# Patient Record
Sex: Female | Born: 1960 | Race: Black or African American | Hispanic: No | Marital: Single
Health system: Southern US, Community
[De-identification: ages and names within clinical notes are randomized; demographics above are authoritative.]

## PROBLEM LIST (undated history)

## (undated) DIAGNOSIS — E785 Hyperlipidemia, unspecified: Secondary | ICD-10-CM

## (undated) DIAGNOSIS — F0390 Unspecified dementia without behavioral disturbance: Secondary | ICD-10-CM

## (undated) DIAGNOSIS — G40909 Epilepsy, unspecified, not intractable, without status epilepticus: Secondary | ICD-10-CM

## (undated) DIAGNOSIS — Q909 Down syndrome, unspecified: Secondary | ICD-10-CM

## (undated) DIAGNOSIS — I10 Essential (primary) hypertension: Secondary | ICD-10-CM

---

## 2017-03-14 ENCOUNTER — Emergency Department (HOSPITAL_COMMUNITY)
Admission: EM | Admit: 2017-03-14 | Discharge: 2017-03-14 | Disposition: A | Discharge status: Home / Self Care | Payer: Medicare Other | Attending: Emergency Medicine | Admitting: Emergency Medicine

## 2017-03-14 ENCOUNTER — Encounter (HOSPITAL_COMMUNITY): Payer: Self-pay | Admitting: Nurse Practitioner

## 2017-03-14 ENCOUNTER — Emergency Department (HOSPITAL_COMMUNITY): Payer: Medicare Other

## 2017-03-14 DIAGNOSIS — Y999 Unspecified external cause status: Secondary | ICD-10-CM | POA: Diagnosis not present

## 2017-03-14 DIAGNOSIS — Y939 Activity, unspecified: Secondary | ICD-10-CM | POA: Insufficient documentation

## 2017-03-14 DIAGNOSIS — Q909 Down syndrome, unspecified: Secondary | ICD-10-CM | POA: Diagnosis not present

## 2017-03-14 DIAGNOSIS — F0390 Unspecified dementia without behavioral disturbance: Secondary | ICD-10-CM | POA: Diagnosis not present

## 2017-03-14 DIAGNOSIS — X509XXA Other and unspecified overexertion or strenuous movements or postures, initial encounter: Secondary | ICD-10-CM | POA: Diagnosis not present

## 2017-03-14 DIAGNOSIS — Y929 Unspecified place or not applicable: Secondary | ICD-10-CM | POA: Diagnosis not present

## 2017-03-14 DIAGNOSIS — S92911A Unspecified fracture of right toe(s), initial encounter for closed fracture: Secondary | ICD-10-CM | POA: Insufficient documentation

## 2017-03-14 DIAGNOSIS — S92912A Unspecified fracture of left toe(s), initial encounter for closed fracture: Secondary | ICD-10-CM

## 2017-03-14 DIAGNOSIS — S91311A Laceration without foreign body, right foot, initial encounter: Secondary | ICD-10-CM | POA: Insufficient documentation

## 2017-03-14 DIAGNOSIS — S99921A Unspecified injury of right foot, initial encounter: Secondary | ICD-10-CM | POA: Diagnosis present

## 2017-03-14 DIAGNOSIS — Z79899 Other long term (current) drug therapy: Secondary | ICD-10-CM | POA: Diagnosis not present

## 2017-03-14 DIAGNOSIS — I10 Essential (primary) hypertension: Secondary | ICD-10-CM | POA: Insufficient documentation

## 2017-03-14 HISTORY — DX: Unspecified dementia, unspecified severity, without behavioral disturbance, psychotic disturbance, mood disturbance, and anxiety: F03.90

## 2017-03-14 HISTORY — DX: Down syndrome, unspecified: Q90.9

## 2017-03-14 HISTORY — DX: Essential (primary) hypertension: I10

## 2017-03-14 MED ORDER — LIDOCAINE HCL (PF) 1 % IJ SOLN
5.0000 mL | Freq: Once | INTRAMUSCULAR | Status: AC
  Administered 2017-03-14: 5 mL

## 2017-03-14 MED ORDER — HYDROCODONE-ACETAMINOPHEN 5-325 MG PO TABS: 1.0000 | ORAL_TABLET | Freq: Four times a day (QID) | ORAL | 0 refills | Status: AC | PRN

## 2017-03-14 MED ORDER — LIDOCAINE-EPINEPHRINE-TETRACAINE (LET) SOLUTION
3.0000 mL | Freq: Once | NASAL | Status: AC
  Administered 2017-03-14: 13:00:00 3 mL via TOPICAL
  Filled 2017-03-14: qty 3

## 2017-03-14 MED ORDER — CEPHALEXIN 500 MG PO CAPS
500.0000 mg | ORAL_CAPSULE | Freq: Once | ORAL | Status: AC
  Administered 2017-03-14: 500 mg via ORAL
  Filled 2017-03-14: qty 1

## 2017-03-14 MED ORDER — IBUPROFEN 200 MG PO TABS
600.0000 mg | ORAL_TABLET | Freq: Once | ORAL | Status: AC
  Administered 2017-03-14: 600 mg via ORAL
  Filled 2017-03-14: qty 3

## 2017-03-14 MED ORDER — LIDOCAINE HCL (PF) 2 % IJ SOLN
INTRAMUSCULAR | Status: AC
  Administered 2017-03-14: 5 mL
  Filled 2017-03-14: qty 10

## 2017-03-14 NOTE — ED Provider Notes (Signed)
[facility]-EMERGENCY DEPT Provider Note   CSN: [id number] Arrival date & time: 03/14/17  1045     History   Chief Complaint No chief complaint on file.   HPI Maureen Hurst is a 55 y.o. female here for foot injury.  THIS IS A SHARED VISIT WITH DR. Juleen China AND I DID THE PROCEDURE TO REPAIR THE LACERATION OF THE TOE.   HPI  Past Medical History:  Diagnosis Date  . Dementia   . Down's syndrome   . Hypertension     There are no active problems to display for this patient.   History reviewed. No pertinent surgical history.  OB History    No data available       Home Medications    Prior to Admission medications   Medication Sig Start Date End Date Taking? Authorizing Provider  fluvoxaMINE (LUVOX) 50 MG tablet Take 50 mg by mouth 2 (two) times daily.   Yes [provider]  levETIRAcetam (KEPPRA) 500 MG tablet Take 500 mg by mouth 2 (two) times daily.   Yes [provider]  Multiple Vitamins-Minerals (CEROVITE SENIOR PO) Take 1 tablet by mouth daily.   Yes [provider]  Olopatadine HCl 0.2 % SOLN Place 1 drop into both eyes daily.   Yes [provider]  pravastatin (PRAVACHOL) 20 MG tablet Take 20 mg by mouth at bedtime.   Yes [provider]  ranitidine (ZANTAC) 150 MG tablet Take 150 mg by mouth daily.   Yes [provider]  traMADol (ULTRAM) 50 MG tablet Take 50 mg by mouth 2 (two) times daily.   Yes [provider]  HYDROcodone-acetaminophen (NORCO/VICODIN) 5-325 MG tablet Take 1 tablet by mouth every 6 (six) hours as needed. 03/14/17   Raeford Razor, MD    Family History History reviewed. No pertinent family history.  Social History Social History  Substance Use Topics  . Smoking status: Unknown If Ever Smoked  . Smokeless tobacco: Not on file  . Alcohol use No     Allergies   Patient has no known allergies.   Review of Systems Review of Systems SEE DR. KOHUT'S NOTE  Physical Exam Updated  Vital Signs BP 126/87 (BP Location: Right Arm)   Pulse 87   Temp 98.5 F (36.9 C) (Oral)   Resp 18   SpO2 99%   Physical Exam SEE DR. KOHUT'S NOTE  ED Treatments / Results  Labs (all labs ordered are listed, but only abnormal results are displayed) Labs Reviewed - No data to display  Radiology Dg Toe 5th Right  Result Date: 03/14/2017 CLINICAL DATA:  Pain.  Laceration in web space. Initial encounter. EXAM: RIGHT FIFTH TOE COMPARISON:  None. FINDINGS: Acute fractures at the shaft/ base the junction of the third, fourth, and fifth proximal phalanges. The fifth proximal phalanx fracture is impacted dorsally and laterally with approximately 40 degrees of angulation. The fourth proximal phalanx fracture is also impacted dorsally. No visualized dislocation. No opaque foreign body along the reported laceration. Limited study due to patient motion.  Please see technologist notes. IMPRESSION: Third through fifth proximal phalanx fractures as described. The fourth and fifth phalanx fractures are impacted and angulated. Electronically Signed   By: Marnee Spring M.D.   On: 03/14/2017 13:10    Procedures .Marland KitchenLaceration Repair Date/Time: 03/14/2017 3:55 PM Performed by: Janne Napoleon Authorized by: Janne Napoleon   Anesthesia (see MAR for exact dosages):    Anesthesia method:  Local infiltration   Local anesthetic:  Lidocaine  1% w/o epi Laceration details:    Location:  Foot   Foot location: under the fold of the Bellantoni toe.   Length (cm):  2 Repair type:    Repair type:  Simple Pre-procedure details:    Preparation:  Patient was prepped and draped in usual sterile fashion and imaging obtained to evaluate for foreign bodies Exploration:    Hemostasis achieved with:  Direct pressure and LET   Wound exploration: wound explored through full range of motion and entire depth of wound probed and visualized     Contaminated: no   Treatment:    Area cleansed with:  Betadine   Irrigation  solution:  Sterile saline   Irrigation method:  Syringe Skin repair:    Repair method:  Sutures   Suture size:  4-0   Suture material:  Prolene   Suture technique:  Simple interrupted   Number of sutures:  3 Approximation:    Approximation:  Close   Vermilion border: well-aligned   Post-procedure details:    Dressing:  Antibiotic ointment and non-adherent dressing   Patient tolerance of procedure:  Tolerated well, no immediate complications    (including critical care time)  Medications Ordered in ED Medications  lidocaine-EPINEPHrine-tetracaine (LET) solution (3 mLs Topical Given 03/14/17 1251)  ibuprofen (ADVIL,MOTRIN) tablet 600 mg (600 mg Oral Given 03/14/17 1250)  cephALEXin (KEFLEX) capsule 500 mg (500 mg Oral Given 03/14/17 1250)  lidocaine (PF) (XYLOCAINE) 1 % injection 5 mL (5 mLs Infiltration Given 03/14/17 1615)  lidocaine (XYLOCAINE) 2 % injection (5 mLs  Given 03/14/17 1454)     Initial Impression / Assessment and Plan / ED Course  I have reviewed the triage vital signs and the nursing notes.  Final Clinical Impressions(s) / ED Diagnoses   Final diagnoses:  Closed displaced fracture of phalanx of toe of left foot, unspecified toe, initial encounter  Laceration of right foot, initial encounter    New Prescriptions Discharge Medication List as of 03/14/2017  4:03 PM    START taking these medications   Details  HYDROcodone-acetaminophen (NORCO/VICODIN) 5-325 MG tablet Take 1 tablet by mouth every 6 (six) hours as needed., Starting Fri 03/14/2017, Print         Ray, [place], NP 03/15/17 0011    Raeford Razor, MD 03/21/17 1359

## 2017-03-14 NOTE — ED Notes (Signed)
[facility] called  

## 2017-03-14 NOTE — ED Triage Notes (Signed)
Patient is from a retirement facility and has downs syndrome. Patient was walking and hit her toe on a piece of furniture and slashed it open. Wound has stopped bleeding patient is not on blood thinners.

## 2017-03-14 NOTE — ED Provider Notes (Signed)
[facility] DEPT Provider Note   CSN: [id number] Arrival date & time: 03/14/17  1045     History   Chief Complaint No chief complaint on file.   HPI Maureen Hurst is a 56 y.o. female.  HPI   56yF with toe/foot laceration. Blunt trauma when stubbed toe.   Past Medical History:  Diagnosis Date  . Dementia   . Down's syndrome   . Hypertension     There are no active problems to display for this patient.   History reviewed. No pertinent surgical history.  OB History    No data available       Home Medications    Prior to Admission medications   Medication Sig Start Date End Date Taking? Authorizing Provider  fluvoxaMINE (LUVOX) 50 MG tablet Take 50 mg by mouth 2 (two) times daily.   Yes [provider]  levETIRAcetam (KEPPRA) 500 MG tablet Take 500 mg by mouth 2 (two) times daily.   Yes [provider]  Multiple Vitamins-Minerals (CEROVITE SENIOR PO) Take 1 tablet by mouth daily.   Yes [provider]  Olopatadine HCl 0.2 % SOLN Place 1 drop into both eyes daily.   Yes [provider]  pravastatin (PRAVACHOL) 20 MG tablet Take 20 mg by mouth at bedtime.   Yes [provider]  ranitidine (ZANTAC) 150 MG tablet Take 150 mg by mouth daily.   Yes [provider]  traMADol (ULTRAM) 50 MG tablet Take 50 mg by mouth 2 (two) times daily.   Yes [provider]    Family History History reviewed. No pertinent family history.  Social History Social History  Substance Use Topics  . Smoking status: Unknown If Ever Smoked  . Smokeless tobacco: Not on file  . Alcohol use No     Allergies   Patient has no known allergies.   Review of Systems Review of Systems  Level 5 caveat because of dementia.  Physical Exam Updated Vital Signs There were no vitals taken for this visit.  Physical Exam  Constitutional: She appears well-developed and well-nourished. No distress.  HENT:  Head: Normocephalic  and atraumatic.  Eyes: Conjunctivae are normal. Right eye exhibits no discharge. Left eye exhibits no discharge.  Neck: Neck supple.  Cardiovascular: Normal rate, regular rhythm and normal heart sounds.  Exam reveals no gallop and no friction rub.   No murmur heard. Pulmonary/Chest: Effort normal and breath sounds normal. No respiratory distress.  Abdominal: Soft. She exhibits no distension. There is no tenderness.  Musculoskeletal: She exhibits tenderness.  Laceration along fold r Morgenstern toe and into web space. No active bleeding. Diffuse tenderness of distal lateral MT and toes.   Neurological: She is alert.  Skin: Skin is warm and dry.  Psychiatric: She has a normal mood and affect. Her behavior is normal. Thought content normal.  Nursing note and vitals reviewed.    ED Treatments / Results  Labs (all labs ordered are listed, but only abnormal results are displayed) Labs Reviewed - No data to display  EKG  EKG Interpretation None       Radiology No results found.   Dg Toe 5th Right  Result Date: 03/14/2017 CLINICAL DATA:  Pain.  Laceration in web space. Initial encounter. EXAM: RIGHT FIFTH TOE COMPARISON:  None. FINDINGS: Acute fractures at the shaft/ base the junction of the third, fourth, and fifth proximal phalanges. The fifth proximal phalanx fracture is impacted dorsally and laterally with approximately 40 degrees of angulation. The fourth proximal  phalanx fracture is also impacted dorsally. No visualized dislocation. No opaque foreign body along the reported laceration. Limited study due to patient motion.  Please see technologist notes. IMPRESSION: Third through fifth proximal phalanx fractures as described. The fourth and fifth phalanx fractures are impacted and angulated. Electronically Signed   By: Marnee Spring M.D.   On: 03/14/2017 13:10   Procedures Procedures (including critical care time)  Medications Ordered in ED Medications    lidocaine-EPINEPHrine-tetracaine (LET) solution (not administered)  ibuprofen (ADVIL,MOTRIN) tablet 600 mg (not administered)  cephALEXin (KEFLEX) capsule 500 mg (not administered)     Initial Impression / Assessment and Plan / ED Course  I have reviewed the triage vital signs and the nursing notes.  Pertinent labs & imaging results that were available during my care of the patient were reviewed by me and considered in my medical decision making (see chart for details).     56yF with R foot/toe pain. Laceration close. Post op shoe. PRn pain meds. Ortho FU.   Final Clinical Impressions(s) / ED Diagnoses   Final diagnoses:  Closed displaced fracture of phalanx of toe of left foot, unspecified toe, initial encounter  Laceration of right foot, initial encounter    New Prescriptions New Prescriptions   No medications on file     Raeford Razor, MD 03/21/17 1358

## 2017-03-20 ENCOUNTER — Encounter: Payer: Medicare Other | Admitting: Podiatry

## 2017-03-21 NOTE — Progress Notes (Signed)
This encounter was created in error - please disregard.

## 2018-09-12 ENCOUNTER — Emergency Department (HOSPITAL_COMMUNITY)
Admission: EM | Admit: 2018-09-12 | Discharge: 2018-09-13 | Disposition: A | Discharge status: Home / Self Care | Payer: Medicare Other | Attending: Emergency Medicine | Admitting: Emergency Medicine

## 2018-09-12 ENCOUNTER — Emergency Department (HOSPITAL_COMMUNITY): Payer: Medicare Other

## 2018-09-12 ENCOUNTER — Encounter (HOSPITAL_COMMUNITY): Payer: Self-pay | Admitting: Emergency Medicine

## 2018-09-12 DIAGNOSIS — R102 Pelvic and perineal pain: Secondary | ICD-10-CM | POA: Diagnosis not present

## 2018-09-12 DIAGNOSIS — Z0441 Encounter for examination and observation following alleged adult rape: Secondary | ICD-10-CM | POA: Diagnosis not present

## 2018-09-12 DIAGNOSIS — S40022A Contusion of left upper arm, initial encounter: Secondary | ICD-10-CM | POA: Diagnosis not present

## 2018-09-12 DIAGNOSIS — S7012XA Contusion of left thigh, initial encounter: Secondary | ICD-10-CM | POA: Diagnosis not present

## 2018-09-12 DIAGNOSIS — N898 Other specified noninflammatory disorders of vagina: Secondary | ICD-10-CM | POA: Diagnosis not present

## 2018-09-12 DIAGNOSIS — Y999 Unspecified external cause status: Secondary | ICD-10-CM | POA: Diagnosis not present

## 2018-09-12 DIAGNOSIS — Y929 Unspecified place or not applicable: Secondary | ICD-10-CM | POA: Insufficient documentation

## 2018-09-12 DIAGNOSIS — T07XXXA Unspecified multiple injuries, initial encounter: Secondary | ICD-10-CM

## 2018-09-12 DIAGNOSIS — S40021A Contusion of right upper arm, initial encounter: Secondary | ICD-10-CM | POA: Insufficient documentation

## 2018-09-12 DIAGNOSIS — F0390 Unspecified dementia without behavioral disturbance: Secondary | ICD-10-CM | POA: Insufficient documentation

## 2018-09-12 DIAGNOSIS — S4992XA Unspecified injury of left shoulder and upper arm, initial encounter: Secondary | ICD-10-CM | POA: Diagnosis present

## 2018-09-12 DIAGNOSIS — Y939 Activity, unspecified: Secondary | ICD-10-CM | POA: Insufficient documentation

## 2018-09-12 DIAGNOSIS — Z79899 Other long term (current) drug therapy: Secondary | ICD-10-CM | POA: Diagnosis not present

## 2018-09-12 DIAGNOSIS — X58XXXA Exposure to other specified factors, initial encounter: Secondary | ICD-10-CM | POA: Insufficient documentation

## 2018-09-12 LAB — CBC WITH DIFFERENTIAL/PLATELET
ABS IMMATURE GRANULOCYTES: 0.03 10*3/uL (ref 0.00–0.07)
Basophils Absolute: 0 10*3/uL (ref 0.0–0.1)
Basophils Relative: 1 %
Eosinophils Absolute: 0 10*3/uL (ref 0.0–0.5)
Eosinophils Relative: 0 %
HCT: 42.2 % (ref 36.0–46.0)
Hemoglobin: 13.8 g/dL (ref 12.0–15.0)
IMMATURE GRANULOCYTES: 1 %
Lymphocytes Relative: 24 %
Lymphs Abs: 1.4 10*3/uL (ref 0.7–4.0)
MCH: 32.5 pg (ref 26.0–34.0)
MCHC: 32.7 g/dL (ref 30.0–36.0)
MCV: 99.5 fL (ref 80.0–100.0)
Monocytes Absolute: 0.5 10*3/uL (ref 0.1–1.0)
Monocytes Relative: 9 %
NEUTROS ABS: 3.9 10*3/uL (ref 1.7–7.7)
NEUTROS PCT: 65 %
NRBC: 0 % (ref 0.0–0.2)
PLATELETS: 161 10*3/uL (ref 150–400)
RBC: 4.24 MIL/uL (ref 3.87–5.11)
RDW: 15.3 % (ref 11.5–15.5)
WBC: 5.9 10*3/uL (ref 4.0–10.5)

## 2018-09-12 LAB — BASIC METABOLIC PANEL
ANION GAP: 10 (ref 5–15)
BUN: 17 mg/dL (ref 6–20)
CO2: 21 mmol/L — ABNORMAL LOW (ref 22–32)
Calcium: 9.3 mg/dL (ref 8.9–10.3)
Chloride: 106 mmol/L (ref 98–111)
Creatinine, Ser: 1.1 mg/dL — ABNORMAL HIGH (ref 0.44–1.00)
GFR calc Af Amer: >60 mL/min (ref >60)
GFR calc non Af Amer: 56 mL/min — ABNORMAL LOW (ref >60)
Glucose, Bld: 114 mg/dL — ABNORMAL HIGH (ref 70–99)
Potassium: 4.3 mmol/L (ref 3.5–5.1)
SODIUM: 137 mmol/L (ref 135–145)

## 2018-09-12 MED ORDER — FLUVOXAMINE MALEATE 50 MG PO TABS
50.0000 mg | ORAL_TABLET | Freq: Two times a day (BID) | ORAL | Status: DC
  Administered 2018-09-13 (×2): 50 mg via ORAL
  Filled 2018-09-12 (×3): qty 1

## 2018-09-12 MED ORDER — LEVETIRACETAM 500 MG PO TABS
500.0000 mg | ORAL_TABLET | Freq: Two times a day (BID) | ORAL | Status: DC
  Administered 2018-09-13 (×2): 500 mg via ORAL
  Filled 2018-09-12 (×2): qty 1

## 2018-09-12 MED ORDER — OLOPATADINE HCL 0.1 % OP SOLN
1.0000 [drp] | Freq: Two times a day (BID) | OPHTHALMIC | Status: DC
  Administered 2018-09-13 (×2): 1 [drp] via OPHTHALMIC
  Filled 2018-09-12: qty 5

## 2018-09-12 MED ORDER — ADULT MULTIVITAMIN W/MINERALS CH
1.0000 | ORAL_TABLET | Freq: Every day | ORAL | Status: DC
  Administered 2018-09-13: 1 via ORAL
  Filled 2018-09-12: qty 1

## 2018-09-12 MED ORDER — PRAVASTATIN SODIUM 10 MG PO TABS
20.0000 mg | ORAL_TABLET | Freq: Every day | ORAL | Status: DC
  Administered 2018-09-13: 20 mg via ORAL
  Filled 2018-09-12: qty 2

## 2018-09-12 MED ORDER — FAMOTIDINE 20 MG PO TABS
20.0000 mg | ORAL_TABLET | Freq: Every day | ORAL | Status: DC
  Administered 2018-09-13: 20 mg via ORAL
  Filled 2018-09-12: qty 1
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
## 2018-09-13 ENCOUNTER — Ambulatory Visit (HOSPITAL_COMMUNITY)
Admission: EM | Admit: 2018-09-13 | Discharge: 2018-09-13 | Disposition: A | Discharge status: Home / Self Care | Payer: No Typology Code available for payment source | Source: Ambulatory Visit | Attending: Emergency Medicine | Admitting: Emergency Medicine

## 2018-09-13 DIAGNOSIS — Z0441 Encounter for examination and observation following alleged adult rape: Secondary | ICD-10-CM | POA: Diagnosis present

## 2018-09-13 DIAGNOSIS — S40022A Contusion of left upper arm, initial encounter: Secondary | ICD-10-CM | POA: Diagnosis not present

## 2018-09-13 LAB — URINALYSIS, ROUTINE W REFLEX MICROSCOPIC
BILIRUBIN URINE: NEGATIVE
Glucose, UA: NEGATIVE mg/dL
Hgb urine dipstick: NEGATIVE
Ketones, ur: NEGATIVE mg/dL
LEUKOCYTE UA: NEGATIVE
NITRITE: NEGATIVE
PH: 7 (ref 5.0–8.0)
Protein, ur: NEGATIVE mg/dL
SPECIFIC GRAVITY, URINE: 1.005 (ref 1.005–1.030)
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
## 2018-09-13 NOTE — ED Notes (Signed)
[facility] called for transport.  

## 2018-09-13 NOTE — ED Notes (Signed)
Sane nurse bedside
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
## 2018-09-13 NOTE — ED Notes (Signed)
Sane nurse notified, reported she would arrive within an hour to see Pt. Per Dr Jeraldine Loots, Pt moved to RM 26 to be assessed by sane nurse
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
## 2018-09-13 NOTE — SANE Note (Signed)
Follow-up Phone Call  Patient gives verbal consent for a FNE/SANE follow-up phone call in 48-72 hours: DID NOT ASK THE PT'S SISTER (VANETTA Janowiak). Patient's telephone number: [phone] (PT'S SISTER'S CELL NUMBER). Patient gives verbal consent to leave voicemail at the phone number listed above: DID NOT ASK THE PT'S SISTER. DO NOT CALL between the hours of: N/A   [facility] POLICE DEPARTMENT CASE NUMBER:  [id number]  OFFICER:  [facility] # [id number]  DSS WORKER (ALLISON CLARK) CONTACTED ON 09/13/2018, AT APPROXIMATELY 1549 HOURS.  [place] (JOEY) WAS ALSO NOTIFIED ABOUT THE PT AND ASKED TO CONTACT THE PT'S SISTER IN REFERENCE TO PROVIDING HER WITH NURSING HOME/ASSISTED LIVING INFORMATION AND HOW TO TRANSFER THE PT OUT OF THIS FACILITY.  PT LIVES IN AN ASSISTED LIVING FACILITY:  [facility]  ([address] [place], [place] [address];  [phone]).
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
## 2018-09-13 NOTE — ED Notes (Signed)
Pt placed on purewick 

## 2018-09-13 NOTE — ED Notes (Signed)
Attempted IN and OUT per sane RN and EDP request, unable to complete procedure due to Pt's resistance.  Attempted after placing Pt on bedside commode, without any urine.  Will attempt to obtain urine from bedside commode
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
## 2018-09-13 NOTE — SANE Note (Signed)
On 09/13/2018, at approximately 1700 hours, the SANE/FNE Teacher, music) consult was completed. The primary RN and physician have been notified. [facility] DSS has also been notified.  Please contact the SANE/FNE nurse on call (listed in Amion) with any further concerns.
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
## 2018-09-14 LAB — URINE CULTURE

## 2018-09-15 ENCOUNTER — Telehealth: Payer: Self-pay | Admitting: Emergency Medicine

## 2018-09-15 NOTE — Telephone Encounter (Signed)
Post ED Visit - Positive Culture Follow-up  Culture report reviewed by antimicrobial stewardship pharmacist: [facility] []  Enzo Bi, Pharm.D. []  Celedonio Miyamoto, Pharm.D., BCPS AQ-ID []  Garvin Fila, Pharm.D., BCPS []  Georgina Pillion, [address].D., BCPS []  [place], [address].D., BCPS, AAHIVP []  Estella Husk, Pharm.D., BCPS, AAHIVP []  Lysle Pearl, PharmD, BCPS []  Phillips Climes, PharmD, BCPS []  Agapito Games, PharmD, BCPS []  Verlan Friends, PharmD [x]  Mervyn Gay, PharmD, BCPS []  Vinnie Level, PharmD  [facility] []  Len Childs, PharmD []  Greer Pickerel, PharmD []  Adalberto Cole, PharmD []  Perlie Gold, Rph []  Lonell Face) Jean Rosenthal, PharmD []  Earl Many, PharmD []  Junita Push, PharmD []  Dorna Leitz, PharmD []  Terrilee Files, PharmD []  Lynann Beaver, PharmD []  Keturah Barre, PharmD []  Loralee Pacas, PharmD []  Bernadene Person, PharmD   Positive urine culture Treated with none, asymptomatic,  no further patient follow-up is required at this time.  Berle Mull 09/15/2018, 1:58 PM
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
## 2018-12-20 ENCOUNTER — Encounter (HOSPITAL_COMMUNITY): Payer: Self-pay | Admitting: Emergency Medicine

## 2018-12-20 ENCOUNTER — Other Ambulatory Visit: Payer: Self-pay

## 2018-12-20 ENCOUNTER — Emergency Department (HOSPITAL_COMMUNITY)
Admission: EM | Admit: 2018-12-20 | Discharge: 2018-12-20 | Disposition: A | Discharge status: Home / Self Care | Payer: Medicare Other | Attending: Emergency Medicine | Admitting: Emergency Medicine

## 2018-12-20 DIAGNOSIS — F0390 Unspecified dementia without behavioral disturbance: Secondary | ICD-10-CM | POA: Insufficient documentation

## 2018-12-20 DIAGNOSIS — Z7901 Long term (current) use of anticoagulants: Secondary | ICD-10-CM | POA: Diagnosis not present

## 2018-12-20 DIAGNOSIS — Q909 Down syndrome, unspecified: Secondary | ICD-10-CM | POA: Insufficient documentation

## 2018-12-20 DIAGNOSIS — J029 Acute pharyngitis, unspecified: Secondary | ICD-10-CM | POA: Diagnosis not present

## 2018-12-20 DIAGNOSIS — Z79899 Other long term (current) drug therapy: Secondary | ICD-10-CM | POA: Insufficient documentation

## 2018-12-20 DIAGNOSIS — I10 Essential (primary) hypertension: Secondary | ICD-10-CM | POA: Diagnosis not present

## 2018-12-20 NOTE — ED Notes (Signed)
[facility] has been contacted for patient transport [phone]).

## 2018-12-20 NOTE — Discharge Instructions (Addendum)
Exam today is normal. No redness, swelling, exudate in the throat. Vitals are reassuring.

## 2018-12-20 NOTE — ED Notes (Signed)
Patient unable to sign for discharge paperwork due to disability. Patient's discharge paperwork will be given to transport service to be given to facility.

## 2018-12-20 NOTE — ED Triage Notes (Signed)
Patient arrived by EMS from [facility]. Pt c/o sore throat this morning. Pt has no other complaints.    Pt has down syndrome.
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
## 2018-12-20 NOTE — ED Notes (Signed)
Bed: PV94 Expected date:  Expected time:  Means of arrival:  Comments: Sore throat from [facility]

## 2018-12-20 NOTE — ED Notes (Signed)
Patient was picked up by [facility]  

## 2019-03-10 ENCOUNTER — Emergency Department (HOSPITAL_COMMUNITY): Payer: Medicare Other

## 2019-03-10 ENCOUNTER — Emergency Department (HOSPITAL_COMMUNITY)
Admission: EM | Admit: 2019-03-10 | Discharge: 2019-03-10 | Disposition: A | Discharge status: Home / Self Care | Payer: Medicare Other | Attending: Emergency Medicine | Admitting: Emergency Medicine

## 2019-03-10 ENCOUNTER — Other Ambulatory Visit: Payer: Self-pay

## 2019-03-10 ENCOUNTER — Encounter (HOSPITAL_COMMUNITY): Payer: Self-pay | Admitting: Family Medicine

## 2019-03-10 DIAGNOSIS — W06XXXA Fall from bed, initial encounter: Secondary | ICD-10-CM

## 2019-03-10 DIAGNOSIS — Z79899 Other long term (current) drug therapy: Secondary | ICD-10-CM | POA: Insufficient documentation

## 2019-03-10 DIAGNOSIS — M545 Low back pain, unspecified: Secondary | ICD-10-CM

## 2019-03-10 DIAGNOSIS — I10 Essential (primary) hypertension: Secondary | ICD-10-CM | POA: Diagnosis not present

## 2019-03-10 DIAGNOSIS — M25561 Pain in right knee: Secondary | ICD-10-CM | POA: Diagnosis not present

## 2019-03-10 DIAGNOSIS — M25562 Pain in left knee: Secondary | ICD-10-CM | POA: Diagnosis not present

## 2019-03-10 HISTORY — DX: Epilepsy, unspecified, not intractable, without status epilepticus: G40.909

## 2019-03-10 HISTORY — DX: Hyperlipidemia, unspecified: E78.5

## 2019-03-10 MED ORDER — ACETAMINOPHEN 500 MG PO TABS
1000.0000 mg | ORAL_TABLET | Freq: Once | ORAL | Status: AC
  Administered 2019-03-10: 07:00:00 1000 mg via ORAL
  Filled 2019-03-10: qty 2

## 2019-03-10 NOTE — Discharge Instructions (Addendum)
Thank you for allowing me to care for you today in the Emergency Department.   You were seen today for a fall. Your xrays showed no broken or displaced bones. You may still be sore for a few days.  You can take 1000 mg of Tylenol once every 8 hours for pain.  Return to the emergency department if you develop any fall or injury, uncontrollable pain, develop new numbness or weakness, the worst headache of your life, changes in your vision, respiratory distress, or other new, concerning symptoms.

## 2019-03-10 NOTE — ED Notes (Signed)
Called [facility] for transport.  

## 2019-03-10 NOTE — ED Provider Notes (Signed)
[facility] DEPT Provider Note   CSN: [id number] Arrival date & time: 03/10/19  0430     History   Chief Complaint Chief Complaint  Patient presents with  . Fall    HPI Maureen Hurst is a 58 y.o. female with history of Down syndrome, dimension, seizure disorder, HTN, and HLD who presents to the emergency department by EMS from [place] with a chief complaint of unwitnessed fall.  The patient reports that she fell from her bed.  She is complaining of bilateral knee pain.   Spoke with Psychologist, sport and exercise at [facility], who reports the patient had an unwitnessed fall from her bed.  Staff reports that when they entered the room that the patient was sitting down beside her bed and endorsing bilateral knee pain.   Per chart review, the patient does not take any blood thinners.  Level 5 caveat secondary to dementia.     The history is provided by the patient and the nursing home. The history is limited by a developmental delay. No language interpreter was used.    Past Medical History:  Diagnosis Date  . Dementia ([place])   . Dementia ([place])   . Down's syndrome   . Hyperlipemia   . Hypertension   . Seizure disorder ([place])     There are no active problems to display for this patient.   No past surgical history on file.   OB History   No obstetric history on file.      Home Medications    Prior to Admission medications   Medication Sig Start Date End Date Taking? Authorizing Provider  fluvoxaMINE (LUVOX) 50 MG tablet Take 50 mg by mouth 2 (two) times daily.    [provider]  HYDROcodone-acetaminophen (NORCO/VICODIN) 5-325 MG tablet Take 1 tablet by mouth every 6 (six) hours as needed. Patient not taking: Reported on 09/12/2018 03/14/17   Virgel Manifold, MD  levETIRAcetam (KEPPRA) 500 MG tablet Take 500 mg by mouth 2 (two) times daily.    [provider]  Multiple Vitamins-Minerals (CEROVITE  SENIOR PO) Take 1 tablet by mouth daily.    [provider]  Olopatadine HCl 0.2 % SOLN Place 1 drop into both eyes daily.    [provider]  pravastatin (PRAVACHOL) 20 MG tablet Take 20 mg by mouth at bedtime.    [provider]  ranitidine (ZANTAC) 150 MG tablet Take 150 mg by mouth daily.    [provider]    Family History History reviewed. No pertinent family history.  Social History Social History   Tobacco Use  . Smoking status: Unknown If Ever Smoked  Substance Use Topics  . Alcohol use: No  . Drug use: No     Allergies   Patient has no known allergies.   Review of Systems Review of Systems  Unable to perform ROS: Dementia  Musculoskeletal: Positive for arthralgias and myalgias.     Physical Exam Updated Vital Signs BP (!) 144/85   Pulse 66   Temp 97.7 F (36.5 C) (Oral)   Resp 18   LMP  (LMP Unknown)   SpO2 100%   Physical Exam Vitals signs and nursing note reviewed.  Constitutional:      General: She is not in acute distress.    Appearance: She is obese. She is not ill-appearing, toxic-appearing or diaphoretic.     Comments: Pleasant and in no acute distress  HENT:     Head: Normocephalic and  atraumatic.     Comments: Head is atraumatic Eyes:     Conjunctiva/sclera: Conjunctivae normal.  Neck:     Musculoskeletal: Normal range of motion and neck supple. No muscular tenderness.  Cardiovascular:     Rate and Rhythm: Normal rate and regular rhythm.     Heart sounds: No murmur. No friction rub. No gallop.   Pulmonary:     Effort: Pulmonary effort is normal. No respiratory distress.     Comments: Breath sounds are clear and equal throughout.  No tender palpation to the chest wall or bilateral ribs.  No crepitus. Abdominal:     General: There is no distension.     Palpations: Abdomen is soft.     Comments: Abdomen is obese, but soft and nondistended.  Musculoskeletal:     Comments: Pain with range of motion to  the bilateral knees.  No focal tenderness to palpation bilaterally.  Full active and passive range of motion without pain to the bilateral ankles, hips, shoulders, elbows, and wrists.  She has diffuse tender to palpation to the thoracic and lumbar spinous processes without crepitus or step-offs.  No tenderness to palpation to the cervical spinous processes. NVI.  Skin:    General: Skin is warm.     Capillary Refill: Capillary refill takes less than 2 seconds.     Findings: No bruising, erythema or rash.  Neurological:     Mental Status: She is alert.  Psychiatric:        Behavior: Behavior normal.     ED Treatments / Results  Labs (all labs ordered are listed, but only abnormal results are displayed) Labs Reviewed - No data to display  EKG None  Radiology No results found.  Procedures Procedures (including critical care time)  Medications Ordered in ED Medications  acetaminophen (TYLENOL) tablet 1,000 mg (has no administration in time range)     Initial Impression / Assessment and Plan / ED Course  I have reviewed the triage vital signs and the nursing notes.  Pertinent labs & imaging results that were available during my care of the patient were reviewed by me and considered in my medical decision making (see chart for details).        58 year old female with history of Down syndrome, dimension, seizure disorder, HTN, and HLD presenting by EMS from [facility] secondary to an unwitnessed fall out of bed.  She was endorsing bilateral knee pain to staff.  Throughout history taking, patient answers yes to every question that is asked.  She is hemodynamically stable and pleasant and in no acute distress.  On exam, head appears atraumatic.  She has pain with range of motion of the bilateral knees as well as with tenderness to the midline thoracic and lumbar spine.  Exam is otherwise unremarkable.  Will give Tylenol for pain and order imaging of the bilateral  knees, thoracic, and lumbar spine.  Patient care transferred to [facility] at the end of my shift. Patient presentation, ED course, and plan of care discussed with review of all pertinent labs and imaging. Please see his/her note for further details regarding further ED course and disposition.   Final Clinical Impressions(s) / ED Diagnoses   Final diagnoses:  Fall from bed, initial encounter  Acute pain of both knees  Midline low back pain without sciatica, unspecified chronicity    ED Discharge Orders    None       Debarah Mccumbers A, PA-C 03/10/19 0703    [place],  Canary Brimhristopher J, MD 03/18/19 [phone]

## 2019-03-10 NOTE — ED Triage Notes (Signed)
Patient is from [facility] and transported via [facility] [employer]. Patient had a fall from her bed and she is complaining of left knee pain.

## 2019-07-26 DEATH — deceased

## 2020-04-18 IMAGING — CR DG KNEE COMPLETE 4+V*L*
4 series · 4 of 4 positions shown · non-contrast
Comparison: None.

CLINICAL DATA: Unwitnessed fall with left knee pain.

EXAM:
LEFT KNEE - COMPLETE 4+ VIEW

[x knee obl left]
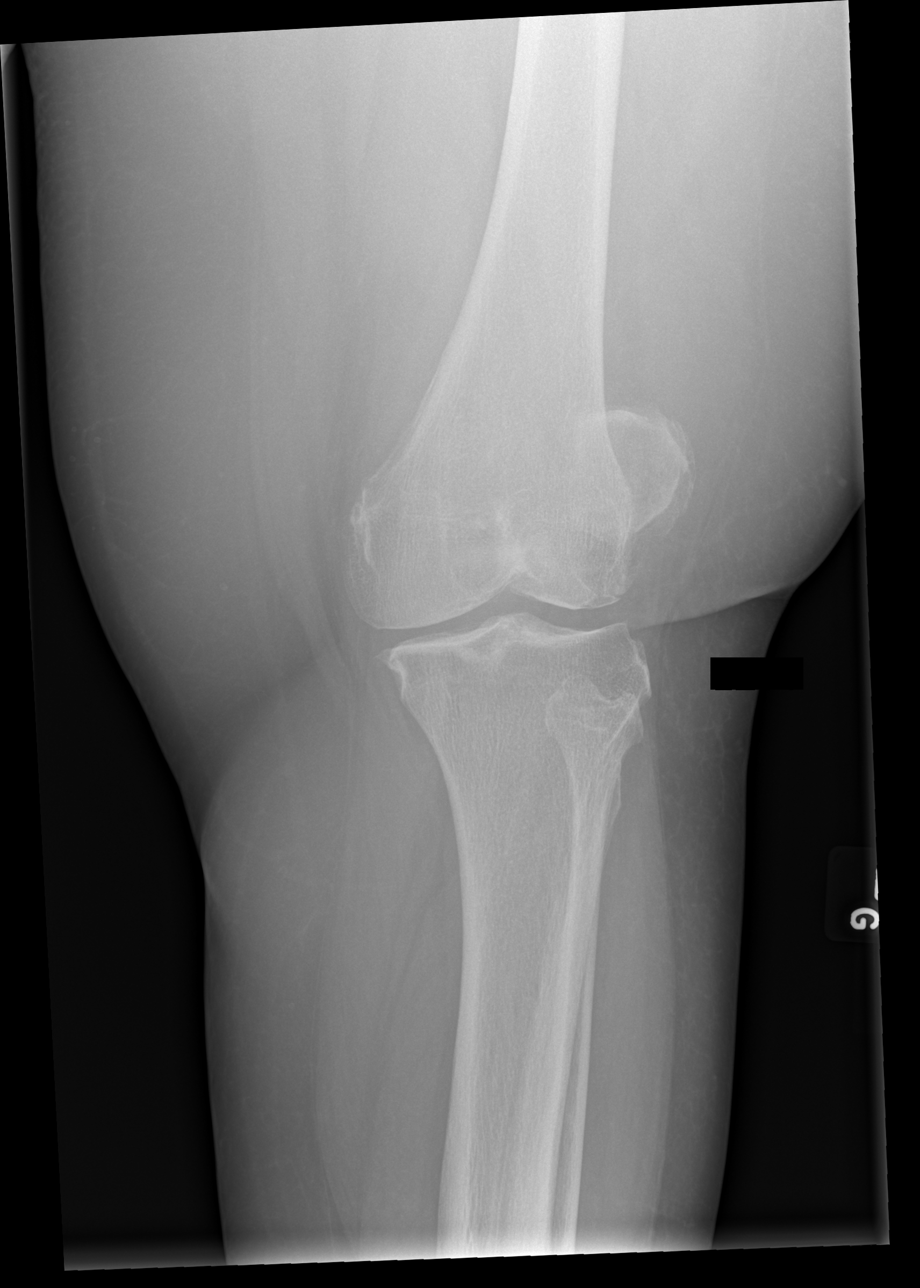

[x knee lat left (1 of 2)]
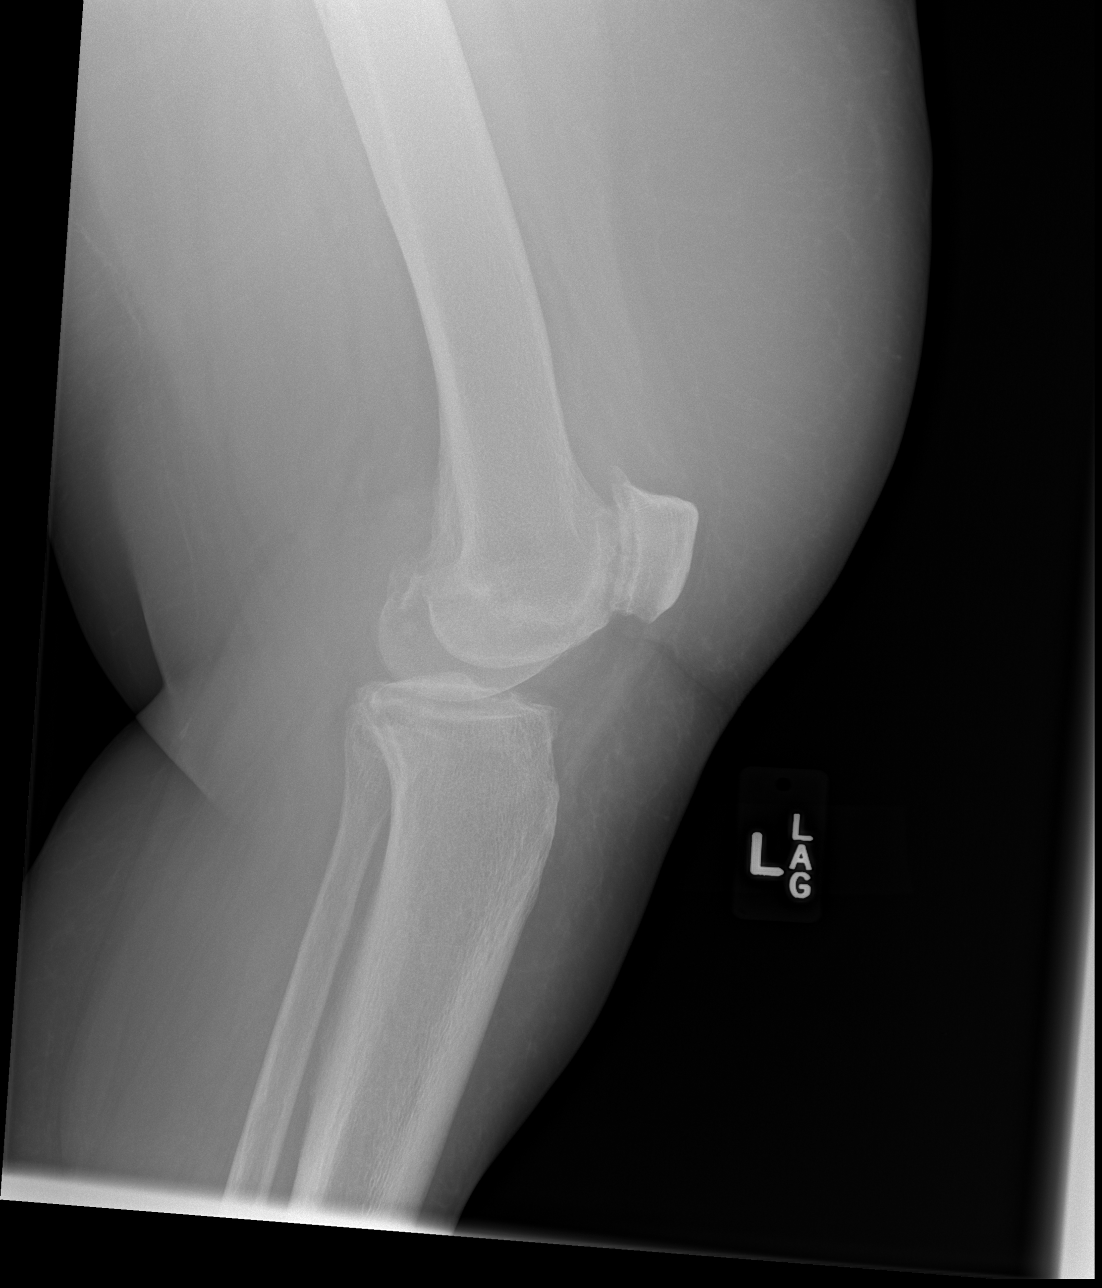

[x knee ap left]
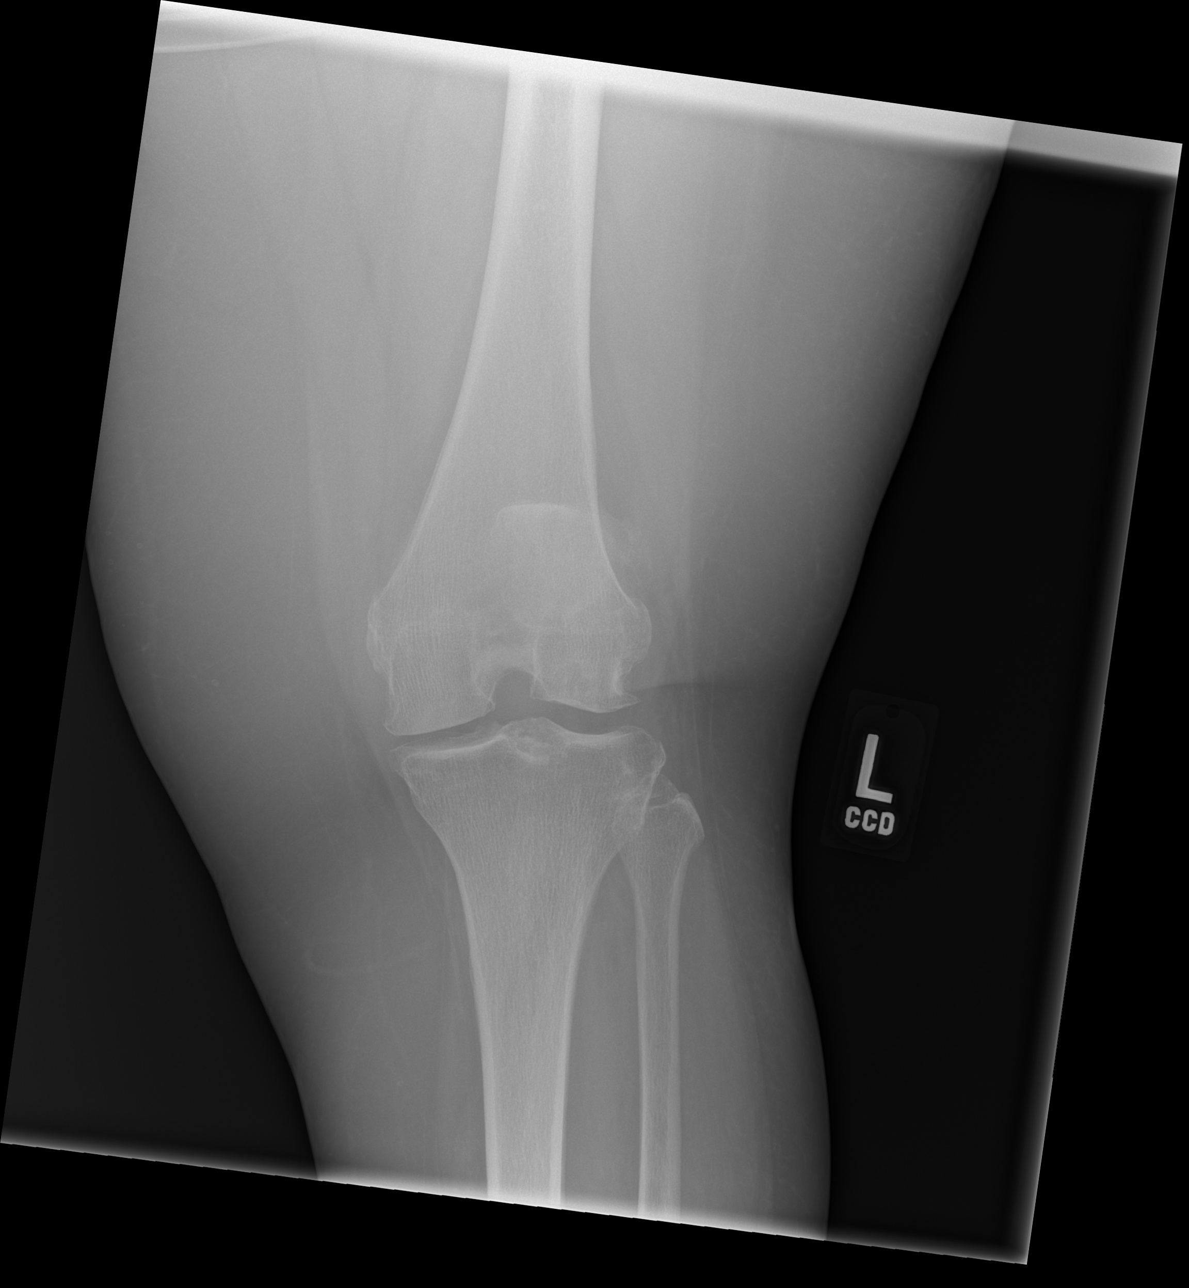

[x knee lat left (2 of 2)]
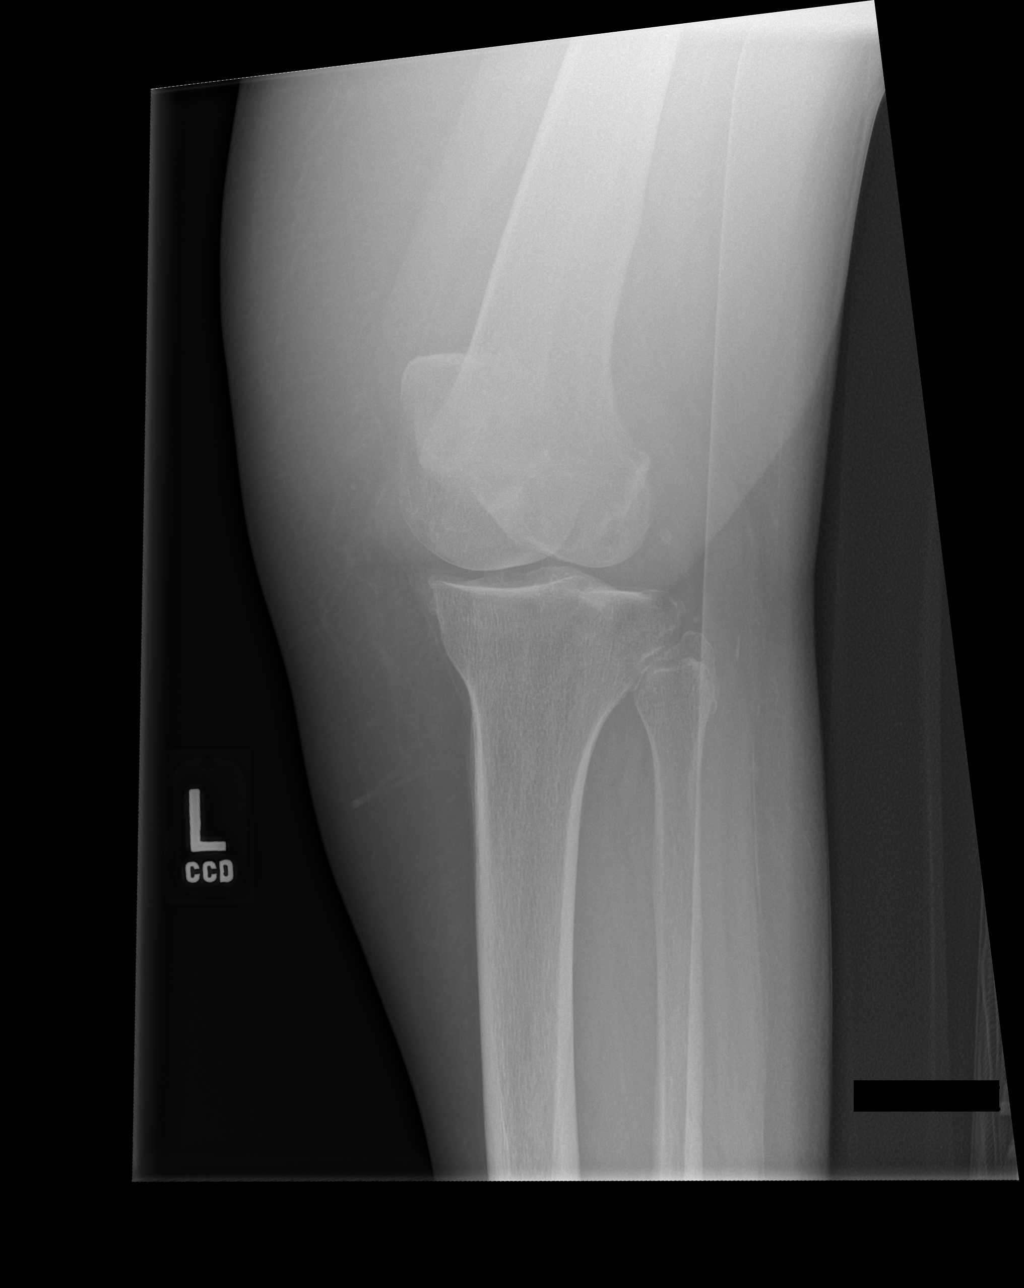

[4 of 4 positions shown; findings below may reference images not displayed]

FINDINGS: Diffuse decreased bone density. Mild tricompartmental osteoarthritis
worse over the patellofemoral joint. No acute fracture or
dislocation. No significant joint effusion.
IMPRESSION: No acute findings.

Mild osteoarthritis.
